# Patient Record
Sex: Female | Born: 1935 | Race: White | Hispanic: No | Marital: Married | Smoking: Never smoker
Health system: Southern US, Community
[De-identification: ages and names within clinical notes are randomized; demographics above are authoritative.]

## PROBLEM LIST (undated history)

## (undated) DIAGNOSIS — K559 Vascular disorder of intestine, unspecified: Secondary | ICD-10-CM

## (undated) DIAGNOSIS — K449 Diaphragmatic hernia without obstruction or gangrene: Secondary | ICD-10-CM

## (undated) DIAGNOSIS — G20 Parkinson's disease: Secondary | ICD-10-CM

## (undated) DIAGNOSIS — G20A1 Parkinson's disease without dyskinesia, without mention of fluctuations: Secondary | ICD-10-CM

## (undated) DIAGNOSIS — C801 Malignant (primary) neoplasm, unspecified: Secondary | ICD-10-CM

## (undated) DIAGNOSIS — G709 Myoneural disorder, unspecified: Secondary | ICD-10-CM

## (undated) DIAGNOSIS — K469 Unspecified abdominal hernia without obstruction or gangrene: Secondary | ICD-10-CM

## (undated) HISTORY — DX: Unspecified abdominal hernia without obstruction or gangrene: K46.9

## (undated) HISTORY — DX: Diaphragmatic hernia without obstruction or gangrene: K44.9

## (undated) HISTORY — DX: Parkinson's disease: G20

## (undated) HISTORY — DX: Myoneural disorder, unspecified: G70.9

## (undated) HISTORY — DX: Vascular disorder of intestine, unspecified: K55.9

## (undated) HISTORY — DX: Malignant (primary) neoplasm, unspecified: C80.1

## (undated) HISTORY — DX: Parkinson's disease without dyskinesia, without mention of fluctuations: G20.A1

---
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
## 1979-11-05 HISTORY — PX: CHOLECYSTECTOMY: SHX55

## 1988-11-04 DIAGNOSIS — G20 Parkinson's disease: Secondary | ICD-10-CM

## 1988-11-04 DIAGNOSIS — G20A1 Parkinson's disease without dyskinesia, without mention of fluctuations: Secondary | ICD-10-CM

## 1988-11-04 HISTORY — DX: Parkinson's disease: G20

## 1988-11-04 HISTORY — DX: Parkinson's disease without dyskinesia, without mention of fluctuations: G20.A1

## 2004-09-18 ENCOUNTER — Ambulatory Visit: Payer: Self-pay | Admitting: Internal Medicine

## 2004-12-09 ENCOUNTER — Inpatient Hospital Stay: Payer: Self-pay | Admitting: Internal Medicine

## 2004-12-09 ENCOUNTER — Other Ambulatory Visit: Payer: Self-pay

## 2005-12-30 ENCOUNTER — Ambulatory Visit: Payer: Self-pay | Admitting: Internal Medicine

## 2006-01-15 ENCOUNTER — Emergency Department: Payer: Self-pay | Admitting: Emergency Medicine

## 2006-03-04 ENCOUNTER — Ambulatory Visit: Payer: Self-pay | Admitting: Internal Medicine

## 2007-03-10 ENCOUNTER — Ambulatory Visit: Payer: Self-pay | Admitting: Internal Medicine

## 2007-10-22 ENCOUNTER — Other Ambulatory Visit: Payer: Self-pay

## 2007-10-22 ENCOUNTER — Emergency Department: Payer: Self-pay | Admitting: Emergency Medicine

## 2007-12-02 ENCOUNTER — Ambulatory Visit: Payer: Self-pay | Admitting: Internal Medicine

## 2007-12-29 ENCOUNTER — Ambulatory Visit: Payer: Self-pay | Admitting: Gastroenterology

## 2008-12-07 ENCOUNTER — Inpatient Hospital Stay: Payer: Self-pay | Admitting: Internal Medicine

## 2009-01-31 ENCOUNTER — Emergency Department: Payer: Self-pay | Admitting: Emergency Medicine

## 2009-03-06 ENCOUNTER — Ambulatory Visit: Payer: Self-pay | Admitting: Unknown Physician Specialty

## 2009-03-21 ENCOUNTER — Ambulatory Visit: Payer: Self-pay | Admitting: Unknown Physician Specialty

## 2009-04-04 ENCOUNTER — Ambulatory Visit: Payer: Self-pay | Admitting: Gynecologic Oncology

## 2009-04-10 ENCOUNTER — Ambulatory Visit: Payer: Self-pay | Admitting: Gynecologic Oncology

## 2009-05-04 ENCOUNTER — Ambulatory Visit: Payer: Self-pay | Admitting: Gynecologic Oncology

## 2009-08-06 ENCOUNTER — Emergency Department: Payer: Self-pay | Admitting: Internal Medicine

## 2010-01-23 ENCOUNTER — Inpatient Hospital Stay: Payer: Self-pay | Admitting: Specialist

## 2011-05-05 ENCOUNTER — Ambulatory Visit: Payer: Self-pay | Admitting: Internal Medicine

## 2011-05-28 ENCOUNTER — Inpatient Hospital Stay: Payer: Self-pay | Admitting: Internal Medicine

## 2011-06-05 ENCOUNTER — Ambulatory Visit: Payer: Self-pay | Admitting: Internal Medicine

## 2011-07-09 ENCOUNTER — Other Ambulatory Visit: Payer: Self-pay | Admitting: Internal Medicine

## 2011-07-09 MED ORDER — AMLODIPINE BESYLATE 5 MG PO TABS: 5.0000 mg | ORAL_TABLET | Freq: Every day | ORAL | Status: AC

## 2011-07-12 ENCOUNTER — Telehealth: Payer: Self-pay | Admitting: *Deleted

## 2011-07-12 NOTE — Telephone Encounter (Signed)
Lorelle Formosa says that patient has a uti. (she says that she faxed the result over to you on wed). She says that she needs an antibiotic. Also they are discharging patient from hospice because she is stable and patient is really upset about it. Patient is refusing to eat and Lorelle Formosa thinks that maybe it is because she is upset about being discharged. Patient is currently on Celexa 20mg  and Lorelle Formosa is asking if you would want to increase it. Patient uses [facility].

## 2011-07-12 NOTE — Telephone Encounter (Signed)
Unfortunately the UA is not in the chart, and there is no note in the chart about it. Can they resend it?

## 2011-07-12 NOTE — Telephone Encounter (Signed)
Left message for Maria Marsh to return my call.

## 2011-07-15 NOTE — Telephone Encounter (Signed)
Spoke with [facility], she will have them refaxed.

## 2011-07-19 ENCOUNTER — Encounter: Payer: Self-pay | Admitting: Internal Medicine

## 2011-07-19 ENCOUNTER — Ambulatory Visit (INDEPENDENT_AMBULATORY_CARE_PROVIDER_SITE_OTHER): Payer: Medicare Other | Admitting: Internal Medicine

## 2011-07-19 DIAGNOSIS — M6281 Muscle weakness (generalized): Secondary | ICD-10-CM

## 2011-07-19 DIAGNOSIS — I10 Essential (primary) hypertension: Secondary | ICD-10-CM

## 2011-07-19 DIAGNOSIS — R29898 Other symptoms and signs involving the musculoskeletal system: Secondary | ICD-10-CM

## 2011-07-19 DIAGNOSIS — M436 Torticollis: Secondary | ICD-10-CM

## 2011-07-21 ENCOUNTER — Encounter: Payer: Self-pay | Admitting: Internal Medicine

## 2011-07-21 DIAGNOSIS — C801 Malignant (primary) neoplasm, unspecified: Secondary | ICD-10-CM | POA: Insufficient documentation

## 2011-07-21 DIAGNOSIS — G709 Myoneural disorder, unspecified: Secondary | ICD-10-CM | POA: Insufficient documentation

## 2011-07-21 DIAGNOSIS — H409 Unspecified glaucoma: Secondary | ICD-10-CM | POA: Insufficient documentation

## 2011-07-21 DIAGNOSIS — I10 Essential (primary) hypertension: Secondary | ICD-10-CM | POA: Insufficient documentation

## 2011-07-21 DIAGNOSIS — K449 Diaphragmatic hernia without obstruction or gangrene: Secondary | ICD-10-CM | POA: Insufficient documentation

## 2011-07-21 DIAGNOSIS — G20 Parkinson's disease: Secondary | ICD-10-CM | POA: Insufficient documentation

## 2011-07-21 NOTE — Progress Notes (Signed)
Subjective:    Patient ID: Maria Marsh, female    DOB: 10/05/1922, 77 y.o.   MRN: [id number]  HPI  Maria Marsh is an 77 yo white female with  a history of severe neck contractures and Parkinsons Disease who is brought to visit by family members.  She was recently admitted to [facility] with dehydration and UTI and recently discharged from Hospice bur continues to live at [facility].  She needs assistance with all ADLS due to her neuromuscular conditions and is unable to walk farther than 2 or 3 steps.  She requires a hospital bed because of her contractures which make it impossible to position her in an ordinary bed.  Past Medical History  Diagnosis Date  . Hernia     Hitatal  . Colitis, ischemic   . Parkinson disease 1990    Dr. Kemper Durie  . Cancer     endometrial  . Glaucoma   . Neuromuscular disorder     Sternal fracture with neck contracture  . Esophageal hiatal hernia     wth stricture, dilated 2010  . Parkinson's disease (tremor, stiffness, slow motion, unstable posture)    Scheduled Meds:   Continuous Infusions:   PRN Meds:.     Review of Systems  Constitutional: Negative for fever, chills and unexpected weight change.  HENT: Positive for neck pain and neck stiffness. Negative for hearing loss, ear pain, nosebleeds, congestion, sore throat, facial swelling, rhinorrhea, sneezing, mouth sores, trouble swallowing, voice change, postnasal drip, sinus pressure, tinnitus and ear discharge.   Eyes: Negative for pain, discharge, redness and visual disturbance.  Respiratory: Negative for cough, chest tightness, shortness of breath, wheezing and stridor.   Cardiovascular: Negative for chest pain, palpitations and leg swelling.  Musculoskeletal: Positive for back pain and gait problem. Negative for myalgias and arthralgias.  Skin: Negative for color change and rash.  Neurological: Positive for tremors and weakness. Negative for dizziness, light-headedness and headaches.  Hematological:  Negative for adenopathy.       Objective:   Physical Exam  Constitutional: She is oriented to person, place, and time. She appears well-developed and well-nourished.  HENT:  Mouth/Throat: Oropharynx is clear and moist.  Eyes: EOM are normal. Pupils are equal, round, and reactive to light. No scleral icterus.  Neck: No JVD present. Rigidity present. No thyromegaly present.    Cardiovascular: Normal rate, regular rhythm, normal heart sounds and intact distal pulses.   Pulmonary/Chest: Effort normal and breath sounds normal.  Abdominal: Soft. Bowel sounds are normal. She exhibits no mass. There is no tenderness.  Musculoskeletal: Normal range of motion. She exhibits no edema.       Right elbow: She exhibits deformity.       Arms: Lymphadenopathy:    She has no cervical adenopathy.  Neurological: She is alert and oriented to person, place, and time.  Skin: Skin is warm and dry.  Psychiatric: She has a normal mood and affect.          Assessment & Plan:  Gait disorder:  Due to chronic neck contractures, spinal stenosis and Parkinsons diease,  patint is unable to ambulate more than 2 steps without 2 2 person maximal assistance and requires a lightweight manual wheelchair for transporation to bathroom and dining room.  Neck contractures:resulting in an severely contorted body habitus.   She cannot be maintained in a regular hospital bed.  Previous attempts to place her using pillows and wedges have been unsuccessful and she is at increased risk for pressure ulcers.  She will need to use a semi electric hospital bed to maintaing positioning that does not result in pressure ulcers.

## 2011-07-21 NOTE — Assessment & Plan Note (Signed)
Uncontrolled currently on multiple medications,.. No readings were available from the facility.  I have asked that they submit daily measurements for one week so I can adjust meds.

## 2011-08-15 ENCOUNTER — Encounter: Payer: Self-pay | Admitting: Internal Medicine

## 2011-09-02 ENCOUNTER — Encounter: Payer: Self-pay | Admitting: Internal Medicine

## 2011-09-04 ENCOUNTER — Telehealth: Payer: Self-pay | Admitting: Internal Medicine

## 2011-09-04 DIAGNOSIS — N390 Urinary tract infection, site not specified: Secondary | ICD-10-CM

## 2011-09-04 MED ORDER — NITROFURANTOIN MONOHYD MACRO 100 MG PO CAPS: 100.0000 mg | ORAL_CAPSULE | Freq: Two times a day (BID) | ORAL | Status: AC

## 2011-09-04 NOTE — Telephone Encounter (Signed)
She has a UTI from E Coli.  rx macrobid 100 mg twice daily x 7 days   #14  rx sent to [facility].

## 2011-09-05 NOTE — Telephone Encounter (Signed)
Notified patients sister and [facility] of the Rx.

## 2011-09-11 ENCOUNTER — Encounter: Payer: Self-pay | Admitting: Internal Medicine

## 2011-09-12 ENCOUNTER — Encounter: Payer: Self-pay | Admitting: Internal Medicine

## 2011-09-12 ENCOUNTER — Telehealth: Payer: Self-pay | Admitting: Internal Medicine

## 2011-09-12 DIAGNOSIS — N390 Urinary tract infection, site not specified: Secondary | ICD-10-CM

## 2011-09-12 MED ORDER — NITROFURANTOIN MONOHYD MACRO 100 MG PO CAPS: 100.0000 mg | ORAL_CAPSULE | Freq: Two times a day (BID) | ORAL | Status: AC

## 2011-09-12 NOTE — Telephone Encounter (Signed)
I have rx Macrobid for her current UTI.  Please call to [facility] for patient.  One tablet twice daily x 7 days  #14 no refills.  Please ask facility to send the cultures and the UAs together and some info re whether patietn is having symptoms so we are not treating asymptomatic infections because we are runnign out of antibiotics to treat her with.

## 2011-09-16 ENCOUNTER — Telehealth: Payer: Self-pay | Admitting: Internal Medicine

## 2011-09-16 NOTE — Telephone Encounter (Signed)
Facility has received Rx.  They will be faxing over the culture results.

## 2011-09-17 NOTE — Telephone Encounter (Signed)
Faxed over order from 07-19-11 for the hospital bed stating that other beds are not possible due to contractures of neck.

## 2011-09-24 ENCOUNTER — Other Ambulatory Visit: Payer: Self-pay | Admitting: Internal Medicine

## 2011-09-24 DIAGNOSIS — M436 Torticollis: Secondary | ICD-10-CM

## 2011-09-27 ENCOUNTER — Other Ambulatory Visit: Payer: Self-pay | Admitting: Internal Medicine

## 2011-10-01 ENCOUNTER — Encounter: Payer: Self-pay | Admitting: Internal Medicine

## 2011-10-01 ENCOUNTER — Encounter: Payer: Self-pay | Admitting: Neurology

## 2011-10-07 ENCOUNTER — Telehealth: Payer: Self-pay | Admitting: Neurology

## 2011-10-07 NOTE — Telephone Encounter (Signed)
jan  cld you call the daughter to find out what there questions are since it appears that i havent seen them.

## 2011-10-07 NOTE — Telephone Encounter (Signed)
Dr. Darrick Huntsman,  Not having met the patient, I am not sure what her condition is, but I guess there is some question that she has a cervical dystonia and may need Botox.  Unfortunately, I don't do chemodenervation so if you think that is where she is going to end up, then perhaps a referral to someone else might make sense.  Stacy Gardner at [facility] is good at this.  we are trying to bring on someone who does botox but this is going to take a while.  Let me know if this only confuses things.  Matt

## 2011-10-07 NOTE — Telephone Encounter (Signed)
Pt is coming in for neck contracture and sister has a few questions. They were told she may need botox injections which she may need to be referred out for. Sister is power of attorney.
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
## 2011-10-08 NOTE — Telephone Encounter (Signed)
Carollee Herter,  See message below regarding need for referral for cervical dysotnia to Stacy Gardner at [facility]  Or [facility] if someone there dose Botx injections.

## 2011-10-10 ENCOUNTER — Encounter: Payer: Self-pay | Admitting: Internal Medicine

## 2011-11-07 ENCOUNTER — Ambulatory Visit: Payer: BC Managed Care – PPO | Admitting: Neurology

## 2011-11-11 ENCOUNTER — Telehealth: Payer: Self-pay | Admitting: Internal Medicine

## 2011-11-11 MED ORDER — HYDROCODONE-ACETAMINOPHEN 5-325 MG PO TABS: 1.0000 | ORAL_TABLET | Freq: Four times a day (QID) | ORAL | Status: DC | PRN

## 2011-11-11 NOTE — Telephone Encounter (Signed)
[phone] Lucille called ms States needs refill her norco hydrocoden 5-325  [facility] can [facility]   Malena Catholic will also need hardcopy of this rx Pt is completely out.

## 2011-11-11 NOTE — Telephone Encounter (Signed)
Script faxed to [facility], fax number [phone].

## 2011-11-11 NOTE — Telephone Encounter (Signed)
rx is on your printer

## 2011-11-14 NOTE — Telephone Encounter (Signed)
Hard copy script mailed to the [facility], attn Lucille.

## 2011-11-18 ENCOUNTER — Encounter: Payer: Self-pay | Admitting: Internal Medicine

## 2012-02-17 ENCOUNTER — Encounter: Payer: Self-pay | Admitting: Internal Medicine

## 2012-05-01 ENCOUNTER — Encounter: Payer: Self-pay | Admitting: Internal Medicine

## 2012-05-25 ENCOUNTER — Encounter: Payer: Self-pay | Admitting: Internal Medicine

## 2012-05-25 ENCOUNTER — Ambulatory Visit (INDEPENDENT_AMBULATORY_CARE_PROVIDER_SITE_OTHER): Payer: Medicare Other | Admitting: Internal Medicine

## 2012-05-25 VITALS — BP 132/68 | HR 94 | Temp 97.9°F | Resp 14

## 2012-05-25 DIAGNOSIS — R600 Localized edema: Secondary | ICD-10-CM | POA: Insufficient documentation

## 2012-05-25 DIAGNOSIS — R609 Edema, unspecified: Secondary | ICD-10-CM

## 2012-05-25 DIAGNOSIS — B372 Candidiasis of skin and nail: Secondary | ICD-10-CM

## 2012-05-25 MED ORDER — GOLD BOND CORNSTARCH PLUS EX POWD: CUTANEOUS | Status: AC

## 2012-05-25 MED ORDER — NYSTATIN 100000 UNIT/GM EX OINT: TOPICAL_OINTMENT | Freq: Two times a day (BID) | CUTANEOUS | Status: DC

## 2012-05-25 NOTE — Progress Notes (Signed)
Patient ID: Maria Marsh, female   DOB: 06-Sep-1922, 78 y.o.   MRN: [id number]  Patient Active Problem List  Diagnosis  . Cancer  . Glaucoma  . Esophageal hiatal hernia  . Neuromuscular disorder  . Parkinson's disease (tremor, stiffness, slow motion, unstable posture)  . Hypertension  . Candidiasis of skin  . Facial edema    Subjective:  CC:   Chief Complaint  Patient presents with  . Facial Swelling    HPI:   Maria Mcfall Johnsonis a 78 y.o. female who presents with chief complaint of swelling and redness on left side of face. It first claims that there is no signs of erythema or swelling of her face. She does have a chronically contracted neck and as a result her left cheek is swollen due to dependent edema from possibly hanging open. However palpation of her jaw and in gentle manipulation of her neck reveals a very erythematous moist region with now order extending from the underside of the left mandible to the skin of the over her left clavicle. She states she has occasional itching but is referring more to her for head which appeared entirely normal today.  Past Medical History  Diagnosis Date  . Hernia     Hitatal  . Colitis, ischemic   . Parkinson disease 1990    Dr. Kemper Durie  . Glaucoma   . Neuromuscular disorder     Sternal fracture with neck contracture  . Esophageal hiatal hernia     wth stricture, dilated 2010  . Parkinson's disease (tremor, stiffness, slow motion, unstable posture)   . Cancer     endometrial    Past Surgical History  Procedure Date  . Appendectomy 1949  . Cholecystectomy 1981         The following portions of the patient's history were reviewed and updated as appropriate: Allergies, current medications, and problem list.    Review of Systems:   Review of Systems  Constitutional: Negative for fever, chills and weight loss.  HENT: Negative for congestion and neck pain.   Eyes: Positive for blurred vision.  Respiratory: Negative  for cough and shortness of breath.   Cardiovascular: Positive for leg swelling. Negative for chest pain.  Gastrointestinal: Positive for constipation. Negative for heartburn, nausea and vomiting.  Genitourinary: Negative for dysuria.  Skin: Positive for itching and rash.  Neurological: Negative for dizziness, focal weakness and headaches.  Endo/Heme/Allergies: Does not bruise/bleed easily.  Psychiatric/Behavioral: Negative for depression. The patient does not have insomnia.       History   Social History  . Marital Status: Married    Spouse Name: N/A    Number of Children: N/A  . Years of Education: N/A   Occupational History  . Not on file.   Social History Main Topics  . Smoking status: Never Smoker   . Smokeless tobacco: Never Used  . Alcohol Use: No  . Drug Use: No  . Sexually Active: Not on file   Other Topics Concern  . Not on file   Social History Narrative  . No narrative on file    Objective:  BP 132/68  Pulse 94  Temp 97.9 F (36.6 C) (Oral)  Resp 14  SpO2 84%  General appearance: alert, cooperative and appears stated age Ears: normal TM's and external ear canals both ears FAce: left cheek slack,  skin thickened non erythematous  Throat: lips, mucosa, and tongue normal; teeth and gums normal Neck: no adenopathy, no carotid bruit, supple, symmetrical, trachea  midline and thyroid not enlarged, symmetric, no tenderness/mass/nodules Back: symmetric, no curvature. ROM normal. No CVA tenderness. Lungs: clear to auscultation bilaterally Heart: regular rate and rhythm, S1, S2 normal, no murmur, click, rub or gallop Abdomen: soft, non-tender; bowel sounds normal; no masses,  no organomegaly Pulses: 2+ and symmetric Skin: erythematous, moist intertigonus area between left mandible and left chest wall due to contracture. Malodourous.  Lymph nodes: Cervical, supraclavicular, and axillary nodes normal.  Assessment and Plan:  Candidiasis of skin Occurring in  the fold between her left mandible and her left shoulder. Brought on by severe contraction of neck. The space in this area is raw and has the odor of yeast. Will start nystatin ointment twice daily with sterile gauze to pack the area. Once the rash is resolved continue antifungal starts to for metastatic treatment with Goldbond powder and continued packing.  Facial edema She does have slight thickening of the skin on the posterior border of the left cheek. This is due to chronic swelling of the left she do to her her torticollis and contracture. I don't think there is anything we can do about this other than to identify is the cause of persistent dependent edema.   Updated Medication List Outpatient Encounter Prescriptions as of 05/25/2012  Medication Sig Dispense Refill  . acetaminophen (TYLENOL) 500 MG tablet Take 500 mg by mouth every 6 (six) hours as needed.        Marland Kitchen amLODipine (NORVASC) 5 MG tablet Take 1 tablet (5 mg total) by mouth daily.  30 tablet  3  . bisacodyl (BISACODYL) 5 MG EC tablet Take 10 mg by mouth daily as needed.      . brimonidine-timolol (COMBIGAN) 0.2-0.5 % ophthalmic solution Place 1 drop into both eyes every 12 (twelve) hours.        . calcitonin, salmon, (MIACALCIN/FORTICAL) 200 UNIT/ACT nasal spray Place 1 spray into the nose daily.        . carbidopa-levodopa (SINEMET) 25-100 MG per tablet Take 1 tablet by mouth 4 (four) times daily.        . citalopram (CELEXA) 20 MG tablet Take 20 mg by mouth daily.        Marland Kitchen docusate sodium (COLACE) 100 MG capsule Take 100 mg by mouth 2 (two) times daily.        Marland Kitchen HYDROcodone-acetaminophen (NORCO) 5-325 MG per tablet Take 1 tablet by mouth every 6 (six) hours as needed for pain.  90 tablet  5  . meloxicam (MOBIC) 15 MG tablet Take 15 mg by mouth daily.        . metoprolol (TOPROL-XL) 50 MG 24 hr tablet Take 50 mg by mouth 2 (two) times daily.        Marland Kitchen oxyCODONE (ROXICODONE INTENSOL) 20 MG/ML concentrated solution Take 10 mg by mouth  every 2 (two) hours as needed.        . pantoprazole (PROTONIX) 40 MG tablet Take 40 mg by mouth daily.        . polyethylene glycol (MIRALAX / GLYCOLAX) packet Take 17 g by mouth daily.        . QUEtiapine (SEROQUEL) 25 MG tablet Take 25 mg by mouth at bedtime.        . sennosides-docusate sodium (SENOKOT-S) 8.6-50 MG tablet Take 1 tablet by mouth daily.        . travoprost, benzalkonium, (TRAVATAN) 0.004 % ophthalmic solution 1 drop at bedtime.        Marland Kitchen nystatin ointment (MYCOSTATIN) Apply topically 2 (  two) times daily. Until the redness has resolved.  Pack area with sterile gauze  30 g  0  . Powders (GOLD BOND CORNSTARCH PLUS) POWD Apply once daily to fold of skin in neck  113 g  0  . DISCONTD: brimonidine (ALPHAGAN) 0.15 % ophthalmic solution 1 drop 3 (three) times daily.        Marland Kitchen DISCONTD: losartan (COZAAR) 100 MG tablet Take 100 mg by mouth daily.        Marland Kitchen DISCONTD: nystatin ointment (MYCOSTATIN) Apply topically 2 (two) times daily. Until the redness has resolved.  Pack area with sterile gauze  30 g  0     No orders of the defined types were placed in this encounter.    No Follow-up on file.

## 2012-05-25 NOTE — Assessment & Plan Note (Signed)
She does have slight thickening of the skin on the posterior border of the left cheek. This is due to chronic swelling of the left she do to her her torticollis and contracture. I don't think there is anything we can do about this other than to identify is the cause of persistent dependent edema.

## 2012-05-25 NOTE — Assessment & Plan Note (Signed)
Occurring in the fold between her left mandible and her left shoulder. Brought on by severe contraction of neck. The space in this area is raw and has the odor of yeast. Will start nystatin ointment twice daily with sterile gauze to pack the area. Once the rash is resolved continue antifungal starts to for metastatic treatment with Goldbond powder and continued packing.

## 2012-05-25 NOTE — Patient Instructions (Addendum)
Maria Marsh has two issues regarding her face,  Both caused by her neck contracture:  1) The cheek is not swollen,  It has dependent edema from her head tilt,  And the skin is becoming thickened as a result.  There is nothing that can be done about it.  2) She has a yeast infection from the skin of her left chin being in constant contact with the skin of her upper chest/shoulder:            Please apply Nystatin ointment twice daily           Pack the area with sterile gauze to prevent the anaerobic environment the skin contact has made.  Once the redness has resolved,  Switch to gold bond Powder applied daily in the crease/pocket and continue to place sterile gauze in the crease to prevent recurrence.

## 2012-06-23 ENCOUNTER — Other Ambulatory Visit: Payer: Self-pay | Admitting: Internal Medicine

## 2012-06-23 DIAGNOSIS — N390 Urinary tract infection, site not specified: Secondary | ICD-10-CM

## 2012-06-23 MED ORDER — CIPROFLOXACIN HCL 250 MG PO TABS: 250.0000 mg | ORAL_TABLET | Freq: Two times a day (BID) | ORAL | Status: AC

## 2012-06-26 ENCOUNTER — Encounter: Payer: Self-pay | Admitting: Internal Medicine

## 2012-07-28 ENCOUNTER — Telehealth: Payer: Self-pay | Admitting: Internal Medicine

## 2012-07-28 NOTE — Telephone Encounter (Signed)
I am not an oncologist. When patient came to me many years ago she had already opted not to have treatment for uterine cancer. A certain amount of spotting is to be expected. The spotting could get worse and she could have frank bleeding. She did also develop obstruction of the bowels if the cancer spread to the bowels. She does not need to be seen.

## 2012-07-28 NOTE — Telephone Encounter (Signed)
Caller: Shirley/Sibling; Patient Name: Maria Marsh; PCP: Duncan Dull (Adults only); Best Callback Phone Number: [phone].  Call regarding Pain and Spotting for Uterine Cancer.  Patient is resident at [facility].  Patient is not with Sister.  Patient has occasional spotting, not everyday.   Patient has Roxycodone as needed for pain, Sister doesn't feel Patient's pain or spotting has worsen since last Office Visit.  Sister doesn't want to have to bring Patient to office if MD doesn't feel she needs to come due to MD told Patient she would have occastional spotting and pain.    Patient opted not to take Chemo or Radiation.  If MD feels Patient needs to be seen, Sister will bring to office, transportation is difficult.  Sister would like MD to follow up with [facility] facility to discuss what is to be expected with Patient's stages of Uterine Cancer.

## 2012-07-29 NOTE — Telephone Encounter (Signed)
Left message asking Talbert Forest to return my call.

## 2012-07-31 NOTE — Telephone Encounter (Signed)
Patients sister notified.  She stated she will call [facility] [place] and explain it to them.

## 2012-08-17 ENCOUNTER — Telehealth: Payer: Self-pay | Admitting: Internal Medicine

## 2012-08-17 NOTE — Telephone Encounter (Signed)
please ask [facility] to send me results of the UA and culture on Maria Marsh .  I cannot treat the patient without seeing her unless they will send a UA and culture at the same time.

## 2012-08-21 NOTE — Telephone Encounter (Signed)
Spoke to [facility] she asked if she could get an order to get the patients urine and send you the results.

## 2012-08-22 NOTE — Telephone Encounter (Signed)
They already did, I reviewed it and sent it back in your red folder makerd "no UTI."

## 2012-08-28 ENCOUNTER — Ambulatory Visit: Payer: BC Managed Care – PPO | Admitting: Internal Medicine

## 2012-09-28 ENCOUNTER — Other Ambulatory Visit: Payer: Self-pay | Admitting: Internal Medicine

## 2012-09-28 MED ORDER — HYDROCODONE-ACETAMINOPHEN 10-325 MG PO TABS: 1.0000 | ORAL_TABLET | Freq: Every day | ORAL | Status: DC

## 2012-09-30 ENCOUNTER — Other Ambulatory Visit: Payer: Self-pay | Admitting: Internal Medicine

## 2012-10-29 ENCOUNTER — Encounter: Payer: Self-pay | Admitting: Internal Medicine

## 2012-10-30 ENCOUNTER — Encounter: Payer: Self-pay | Admitting: Adult Health

## 2012-10-30 ENCOUNTER — Ambulatory Visit (INDEPENDENT_AMBULATORY_CARE_PROVIDER_SITE_OTHER): Payer: Medicare Other | Admitting: Adult Health

## 2012-10-30 VITALS — BP 160/60 | HR 62 | Resp 16

## 2012-10-30 DIAGNOSIS — Z79899 Other long term (current) drug therapy: Secondary | ICD-10-CM | POA: Insufficient documentation

## 2012-10-30 DIAGNOSIS — T148XXA Other injury of unspecified body region, initial encounter: Secondary | ICD-10-CM | POA: Insufficient documentation

## 2012-10-30 MED ORDER — DOXYCYCLINE HYCLATE 100 MG PO TABS: 100.0000 mg | ORAL_TABLET | Freq: Two times a day (BID) | ORAL | Status: DC

## 2012-10-30 MED ORDER — POLYETHYLENE GLYCOL 3350 17 G PO PACK: PACK | ORAL | Status: AC

## 2012-10-30 NOTE — Assessment & Plan Note (Addendum)
Evaluation of medications with patient and family. Evaluation of continued benefit and side effect of her medications. Patient has been taking Miralax daily resulting in episodes of diarrhea. She is on pain medication for her contractures. Decreased Miralax to every 2 days. If she starts to get constipated, then can give the Miralax every other day. D/C Calcitonin, Calcium with Vit D. Also discontinued nystatin cream. All other medications were available to her on a prn status and felt that she may benefit from any one of them at a given time. Family agreed. Note that greater than 40 min were spent with patient/family in face to face communication.
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
## 2012-10-30 NOTE — Assessment & Plan Note (Addendum)
Purulent drainage noted. Culture sent. Start Doxycycline 100 mg bid x 10 days. Home Health referral for wound management. Referred to [facility]. Family in agreement.

## 2012-10-30 NOTE — Patient Instructions (Addendum)
  Please start doxycycline 100 mg twice daily for 10 days. [facility] [address].  [facility] referral for wound care. They will begin tomorrow.  Medication adjustments per discussion. Documentation sent to [facility].

## 2012-11-06 LAB — WOUND CULTURE: Gram Stain: NONE SEEN

## 2012-11-06 IMAGING — CR DG CHEST 1V PORT
1 series · 1 of 1 positions shown · non-contrast
Comparison: none

REASON FOR EXAM: fever
COMMENTS:

PROCEDURE:     DXR - DXR PORTABLE CHEST SINGLE VIEW  - May 28, 2011  [DATE]
RESULT:     Comparison: None

[view not recorded]
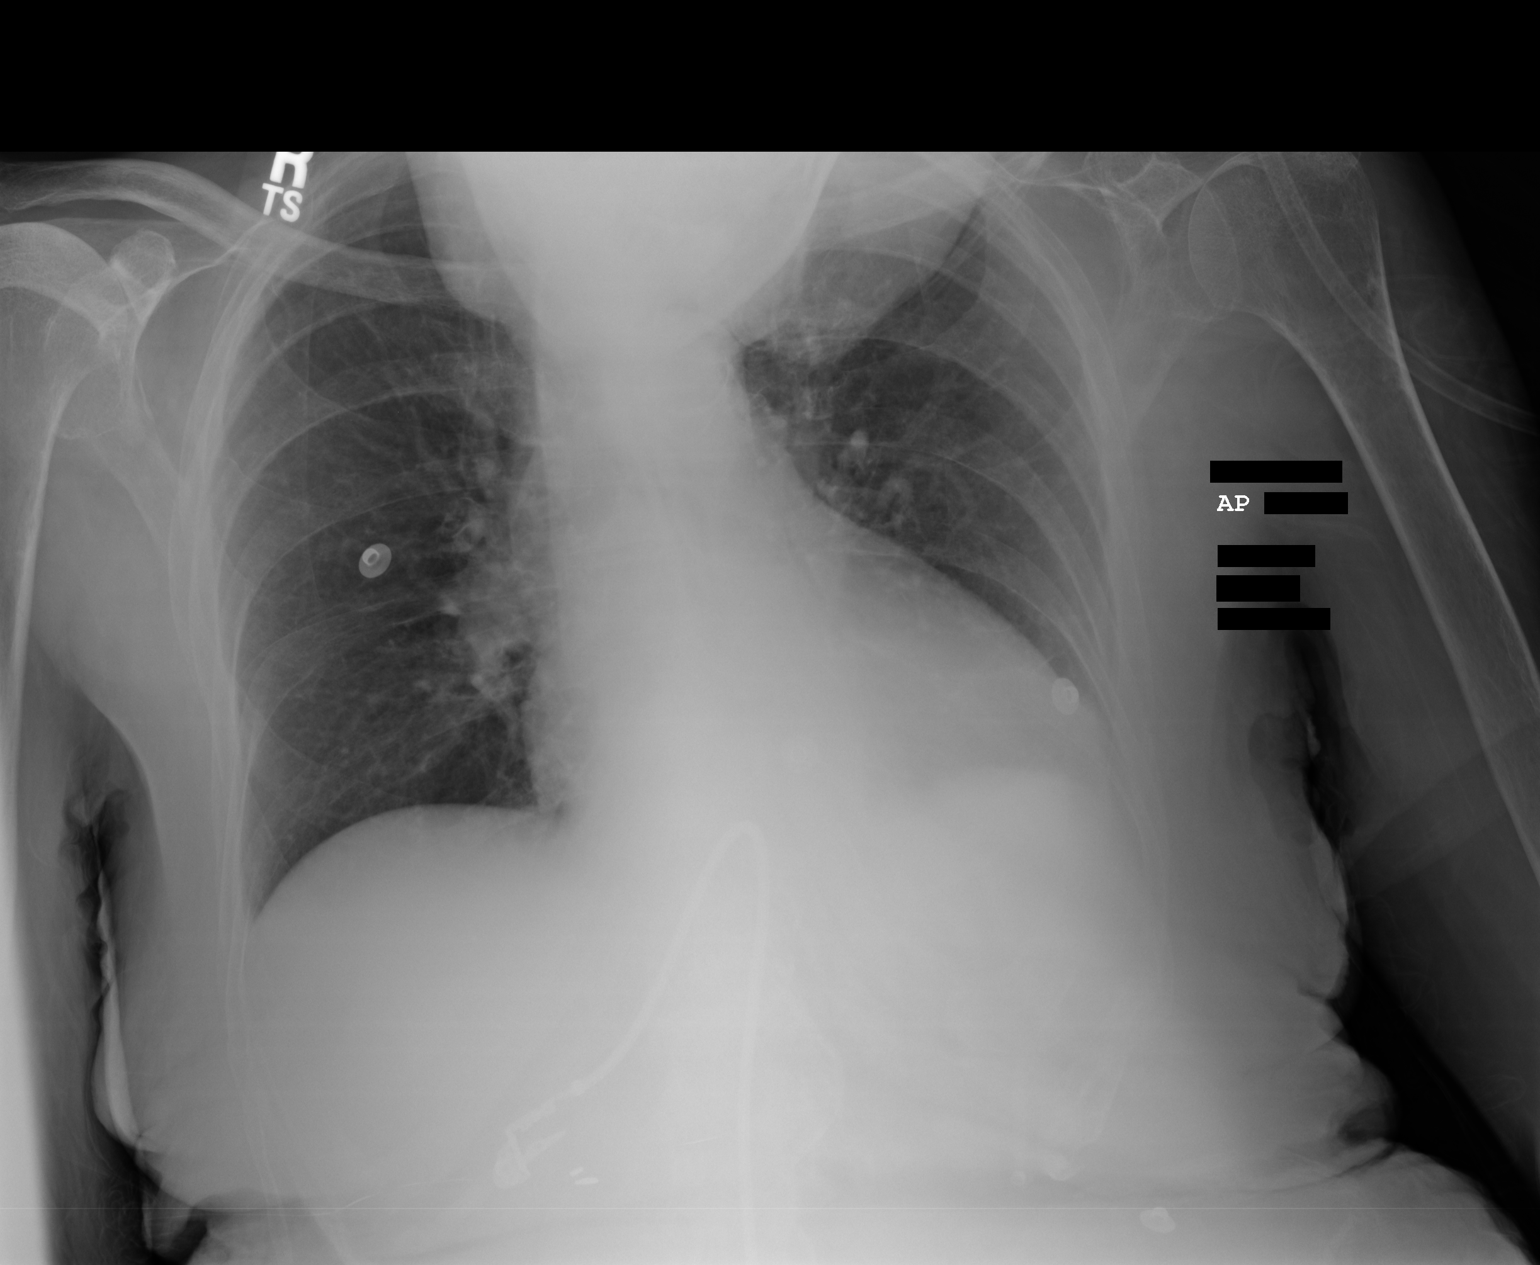

[1 of 1 positions shown; findings below may reference images not displayed]

FINDINGS: Single portable AP chest radiograph is provided.  There is no focal
parenchymal opacity, pleural effusion, or pneumothorax. Normal
cardiomediastinal silhouette. The osseous structures are unremarkable.
IMPRESSION: No acute disease of the chest.

## 2012-11-06 IMAGING — CT CT ABD-PELV W/ CM
1 of 2 series · 14 of 32 positions shown, 18 images · IV contrast (isovue)
Comparison: None

REASON FOR EXAM: (1) diffuse abdominal pain, fever; (2) diffuse abdominal
pain, fever
COMMENTS:

PROCEDURE:     CT  - CT ABDOMEN / PELVIS  W  - May 28, 2011  [DATE]
RESULT:     History: Abdominal pain
TECHNIQUE: Multiple axial images of the abdomen and pelvis were performed
from the lung bases to the pubic symphysis, without p.o. contrast and with
100 ml of Isovue 370 intravenous contrast.

[Series 3: 3mm soft tissue · axial · 0.72mm/px · z∈[-474,-110]mm · 14 of 138 slices shown, 18 images]
[im 11/138  soft-tissue]
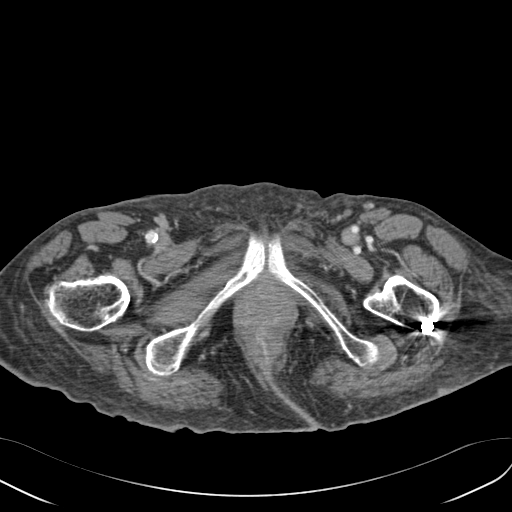
[im 11/138  bone]
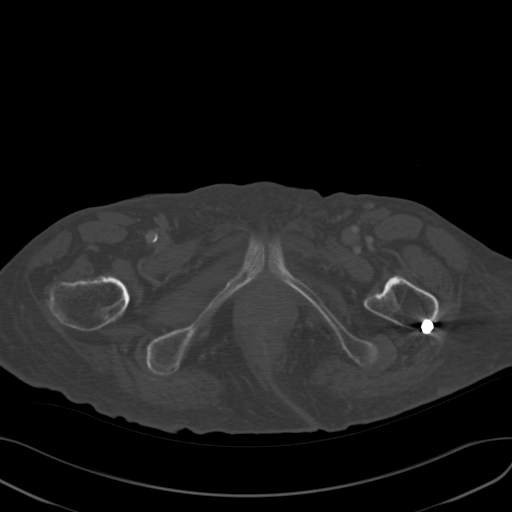
[im 22/138  soft-tissue]
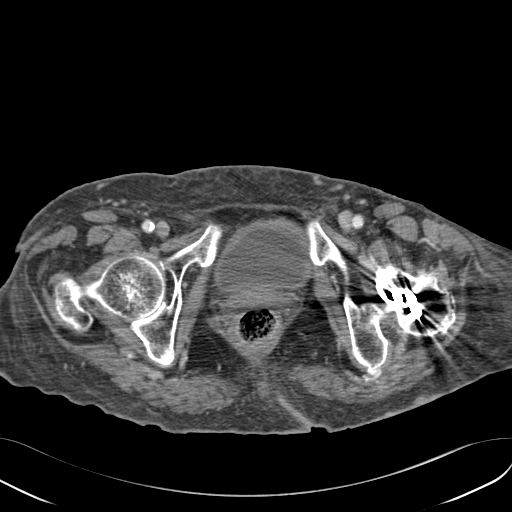
[im 33/138  soft-tissue]
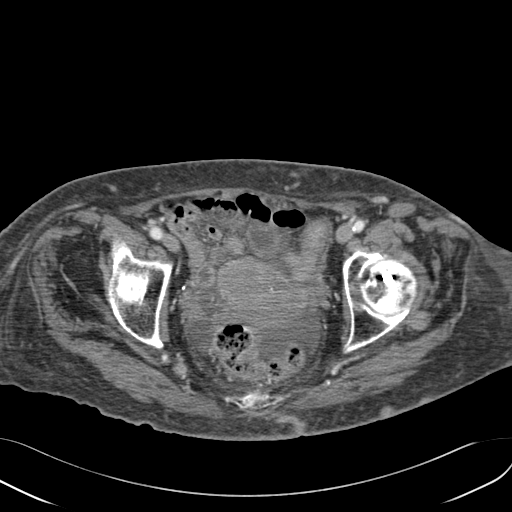
[im 44/138  soft-tissue]
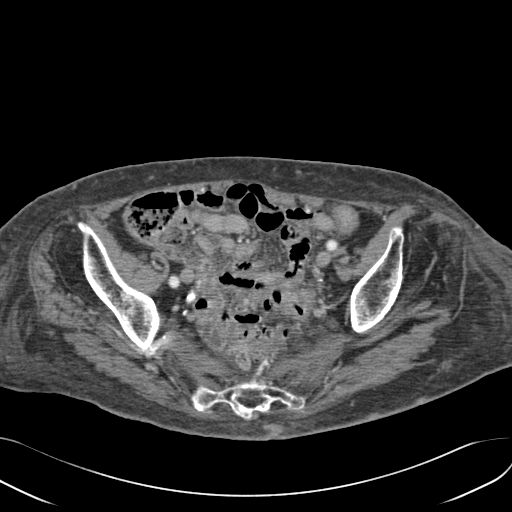
[im 55/138  soft-tissue]
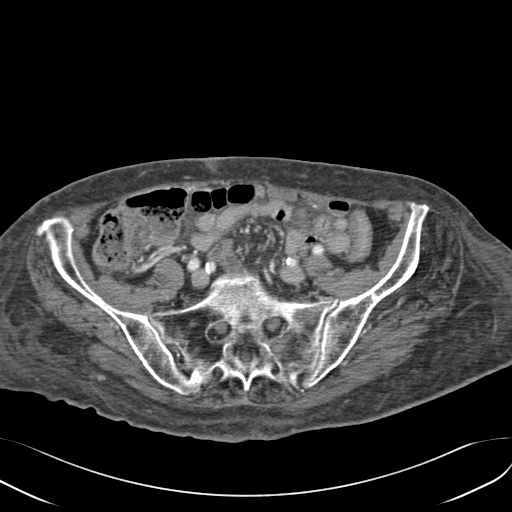
[im 66/138  soft-tissue]
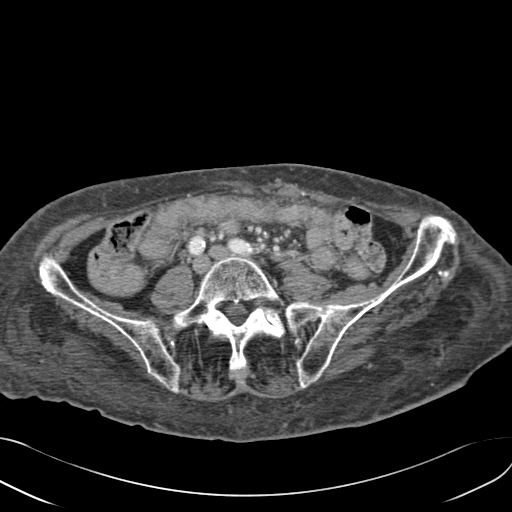
[im 77/138  soft-tissue]
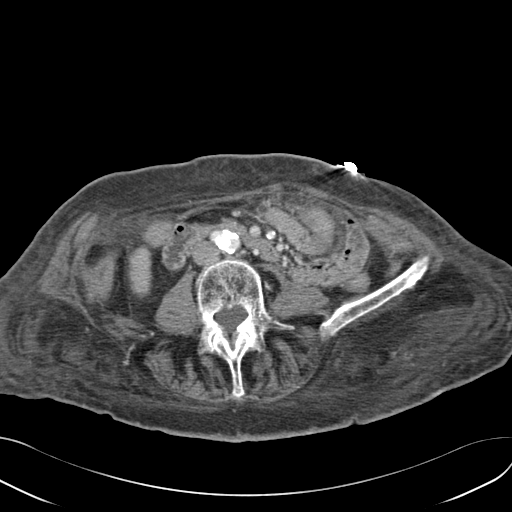
[im 88/138  soft-tissue]
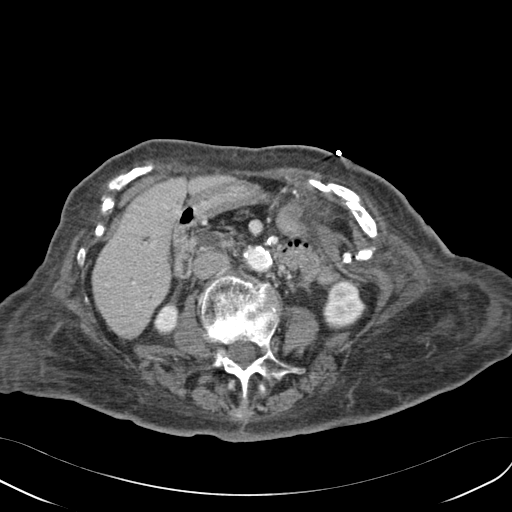
[im 99/138  soft-tissue]
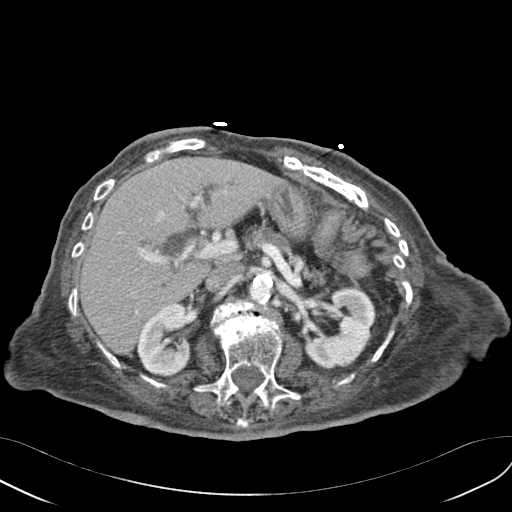
[im 99/138  bone]
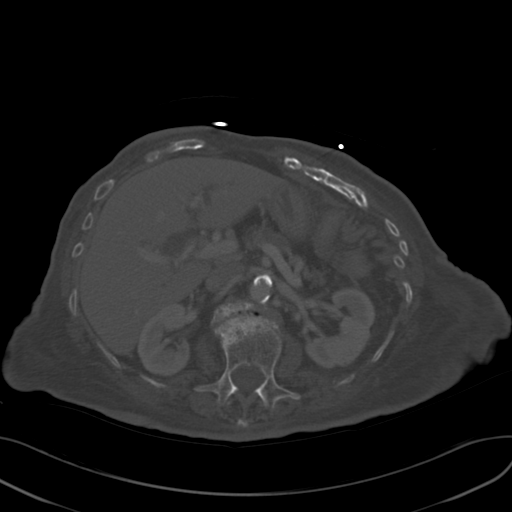
[im 110/138  soft-tissue]
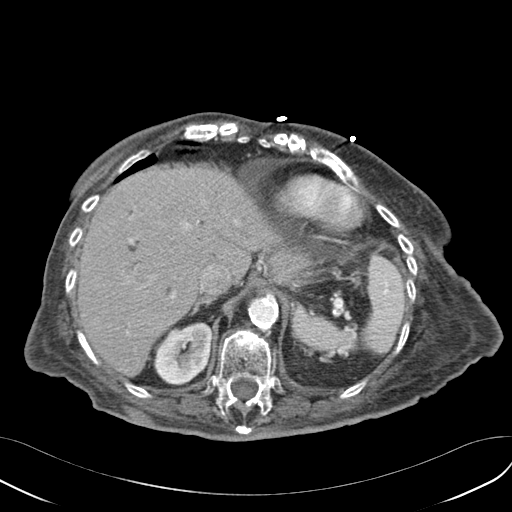
[im 116/138  lung]
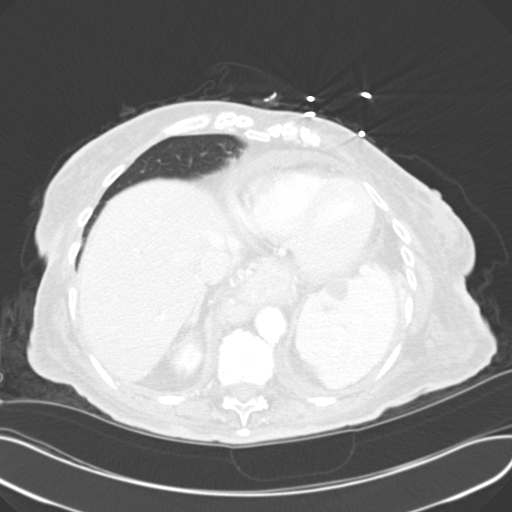
[im 121/138  soft-tissue]
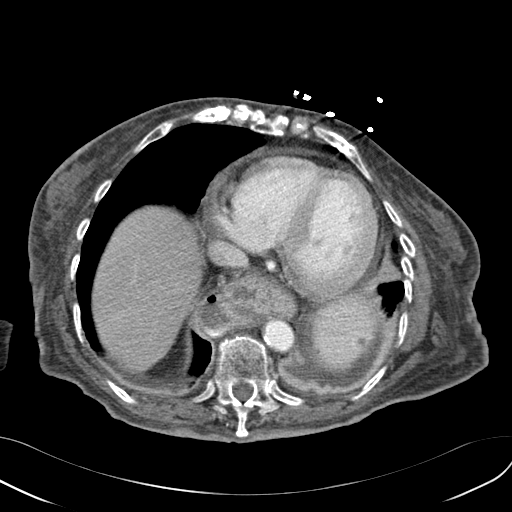
[im 121/138  lung]
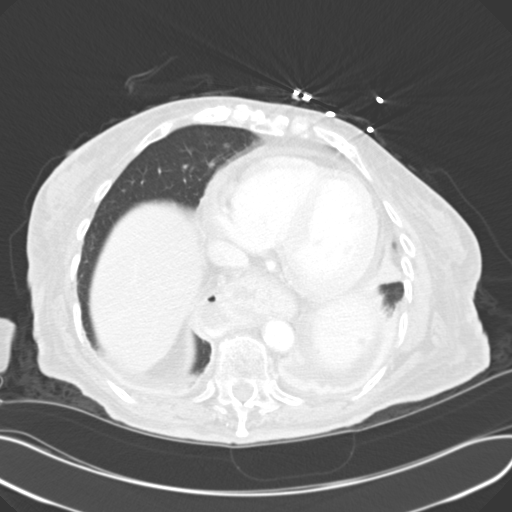
[im 127/138  lung]
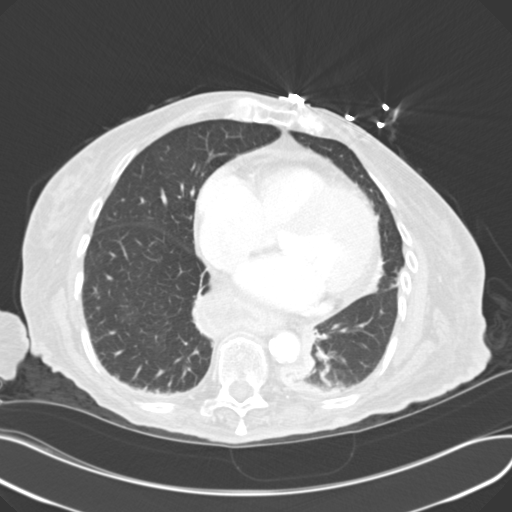
[im 132/138  soft-tissue]
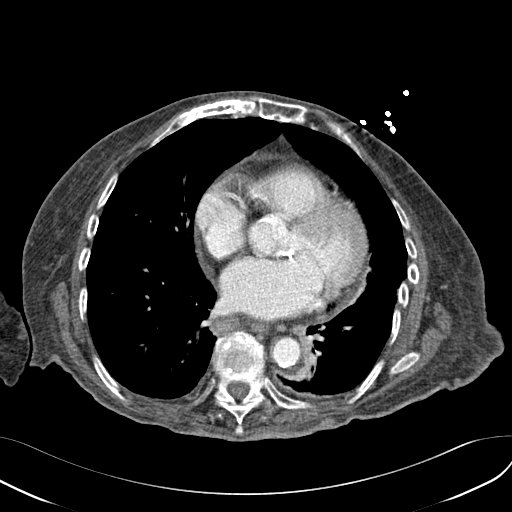
[im 132/138  lung]
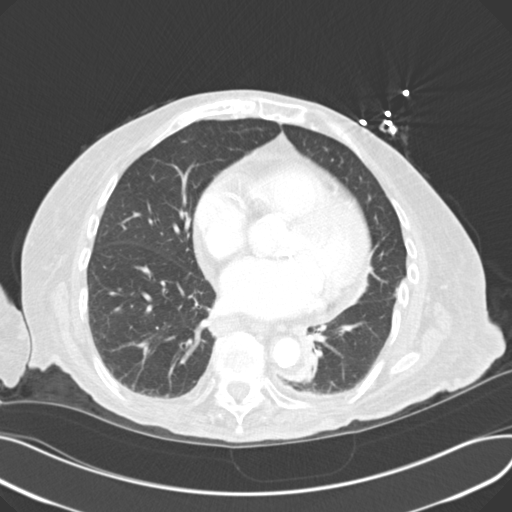

[14 of 32 positions shown; findings below may reference images not displayed]

FINDINGS: Evaluation of the bowel is extremely limited secondary to lack of enteric
contrast.

There is a small left pleural effusion. There is associated left basilar
airspace disease which may represent dependent atelectasis.. There is no
pneumothorax. The heart size is enlarged. There is a small pericardial
effusion versus pericardial thickening.

The liver demonstrates no focal abnormality. There is intrahepatic and
extrahepatic biliary ductal dilatation. The gallbladder is surgically
absent. The spleen demonstrates no focal abnormality. The kidneys, adrenal
glands, and pancreas are normal. There is mild bladder wall thickening which
may be secondary to cystitis.

There is a moderate size hiatal hernia. There is a mild bowel wall
thickening involving the ascending, descending, transverse and sigmoid
colon. There is mild pericolonic inflammatory change. There is a small
amount of nonspecific pelvic free fluid. There is no pneumoperitoneum,
pneumatosis, or portal venous gas. There is no lymphadenopathy. There is a
2.6 cm right ovarian the cystic mass.

The abdominal aorta is normal in caliber with atherosclerosis.

The osseous structures are unremarkable.
IMPRESSION: 1. Evaluation of the bowel is extremely limited secondary to lack of enteric
contrast. There is relative bowel wall thickening involving the ascending,
transverse, descending and sigmoid colon with mild pericolonic inflammatory
change concerning for colitis [REDACTED] be secondary to an infectious or
inflammatory etiology.

2. Intrahepatic and extrahepatic biliary ductal dilatation. The common bile
duct tapers to normal caliber at the pancreatic head. A distal stricture
cannot be excluded. The appearance may be secondary to a post
cholecystectomy state.

3. Mild bladder wall thickening which may be secondary to cystitis.

4. Cardiomegaly. Small pericardial effusion versus pericardial thickening.

5. There is a 2.6 cm cystic right ovarian mass. Recommend further evaluation
with a pelvic ultrasound.

## 2012-11-12 DIAGNOSIS — M436 Torticollis: Secondary | ICD-10-CM

## 2012-11-12 DIAGNOSIS — I10 Essential (primary) hypertension: Secondary | ICD-10-CM

## 2012-11-12 DIAGNOSIS — B3789 Other sites of candidiasis: Secondary | ICD-10-CM

## 2012-11-12 DIAGNOSIS — L98499 Non-pressure chronic ulcer of skin of other sites with unspecified severity: Secondary | ICD-10-CM

## 2012-11-14 ENCOUNTER — Emergency Department: Payer: Self-pay | Admitting: Emergency Medicine

## 2012-12-31 ENCOUNTER — Other Ambulatory Visit: Payer: Self-pay | Admitting: Internal Medicine

## 2012-12-31 DIAGNOSIS — L8992 Pressure ulcer of unspecified site, stage 2: Secondary | ICD-10-CM

## 2013-01-06 ENCOUNTER — Telehealth: Payer: Self-pay | Admitting: Internal Medicine

## 2013-01-06 NOTE — Telephone Encounter (Signed)
Ok,  [facility] unless patient has another home health agency prefence.  Check with patint

## 2013-01-06 NOTE — Telephone Encounter (Signed)
Kim from Wound center is calling back regarding referral that was placed for pt. She called the pt's care giver Lorayne Marek) and they are wanting home health to come to the pt instead of her coming to the wound care center. The care giver is saying that it is to much trouble to bring pt out.

## 2013-01-07 NOTE — Telephone Encounter (Signed)
Patient's sister Talbert Forest) notified as instructed by telephone. Talbert Forest will find out from her niece and call back if there is another home health agency that they prefer.

## 2013-01-07 NOTE — Telephone Encounter (Signed)
Patient's niece Joyce Gross [place]) called stating that [facility] been seeing the patient for wound care since her visit to the office when patient saw Orville Govern, NP. Joyce Gross stated that they want to stay with [facility] because the wound is looking much better. Joyce Gross states that [place] at [facility] is the nurse that has been going out to see patient. Joyce Gross wants to know if anything has been received from [place] at [facility] or should she call her and have her send information to Dr. Darrick Huntsman.  Call [place] back at [phone]

## 2013-01-07 NOTE — Telephone Encounter (Signed)
Maria Marsh,   Please disregard previous referral to Wound Care if you still have it open.  Please let Joyce Gross her POA know that [facility] is still managing the wound, I did get a preliminary culture from area by RN this morning.

## 2013-01-12 ENCOUNTER — Other Ambulatory Visit: Payer: Self-pay | Admitting: Internal Medicine

## 2013-01-12 ENCOUNTER — Telehealth: Payer: Self-pay | Admitting: Internal Medicine

## 2013-01-12 DIAGNOSIS — L8992 Pressure ulcer of unspecified site, stage 2: Secondary | ICD-10-CM

## 2013-01-12 MED ORDER — CIPROFLOXACIN HCL 250 MG PO TABS: 250.0000 mg | ORAL_TABLET | Freq: Two times a day (BID) | ORAL | Status: DC

## 2013-01-12 NOTE — Telephone Encounter (Signed)
A patient is here for evaluation of pressure ulcer on chest wall due to neck contraction. This is a via Aeronautical engineer from [facility] home health nurse. Please make sure that she has the appointment with wound care. They will need a copy of the wound culture that was done.

## 2013-01-15 ENCOUNTER — Other Ambulatory Visit: Payer: Self-pay | Admitting: Internal Medicine

## 2013-01-15 ENCOUNTER — Telehealth: Payer: Self-pay | Admitting: General Practice

## 2013-01-15 MED ORDER — CIPROFLOXACIN HCL 250 MG PO TABS: 250.0000 mg | ORAL_TABLET | Freq: Two times a day (BID) | ORAL | Status: AC

## 2013-01-15 NOTE — Telephone Encounter (Signed)
Wound is growing  Pseudomonas.  rx sent to facility on March 11.  On the last nursing order there was a message from RN said that Wound Care Center needed to see her.  Nothing is scanned into chart yet.  Ask Maria Marsh to confirm with RN on the case that she suggested Wound Care Referral

## 2013-01-15 NOTE — Telephone Encounter (Signed)
Med faxed to [facility]. Pt sister notified that pt will need to see wound care due to the infection. However [facility] will still also care for pt wounds. Also left a message for Sherrilyn Rist with [facility] to notify her.   Amber was this referral placed?

## 2013-01-15 NOTE — Telephone Encounter (Signed)
Apparently the order for cipro  was never sent back to [facility], but [facility]  I have printed it out please make sure [facility] gets it

## 2013-01-15 NOTE — Telephone Encounter (Signed)
Maria Marsh from [facility] called. Stated family is confused about the wound care referral. Stated they were told on 3/6 that referral was cancelled. However, if inction is present she will advise family that wound care referral is necessary. Has this referral been completed or discontinued. Also based on culture will any Abx bee sent in?

## 2013-01-25 ENCOUNTER — Encounter: Payer: Self-pay | Admitting: Nurse Practitioner

## 2013-01-25 ENCOUNTER — Encounter: Payer: Self-pay | Admitting: Cardiothoracic Surgery

## 2013-02-02 ENCOUNTER — Encounter: Payer: Self-pay | Admitting: Nurse Practitioner

## 2013-02-02 ENCOUNTER — Encounter: Payer: Self-pay | Admitting: Cardiothoracic Surgery

## 2013-02-09 ENCOUNTER — Encounter: Payer: Self-pay | Admitting: Internal Medicine

## 2013-03-18 ENCOUNTER — Telehealth: Payer: Self-pay | Admitting: *Deleted

## 2013-03-18 NOTE — Telephone Encounter (Signed)
Kayleen from [facility] called in reference to patient is allergic to Amoxicillin prescribed and would like a new order for antibiotic. Please advise

## 2013-03-18 NOTE — Telephone Encounter (Signed)
Script called to pharmacy

## 2013-03-18 NOTE — Telephone Encounter (Signed)
Ciprofloxacin 250 mg   twice daily for 7 days  #14 no refills.

## 2013-03-23 ENCOUNTER — Telehealth: Payer: Self-pay | Admitting: Internal Medicine

## 2013-03-23 DIAGNOSIS — L8992 Pressure ulcer of unspecified site, stage 2: Secondary | ICD-10-CM

## 2013-03-23 NOTE — Telephone Encounter (Signed)
Samantha from [facility], called stating that the patient has open wounds on her chin and chest. Lelon Mast was wondering if we could open her back to [facility], pt was just d/c'd from their care on 03/12/13. Please advise.

## 2013-03-23 NOTE — Telephone Encounter (Signed)
Referral made, but since patient has not been seen in office since Dec 27th they may require a face to face OV for new referral

## 2013-04-05 ENCOUNTER — Telehealth: Payer: Self-pay | Admitting: *Deleted

## 2013-04-05 NOTE — Telephone Encounter (Signed)
Facility called for order for urine for UTI order given per Dr. Darrick Huntsman. Patient s/s increased confusion, frequency.

## 2013-04-07 ENCOUNTER — Other Ambulatory Visit: Payer: Self-pay | Admitting: Internal Medicine

## 2013-04-07 MED ORDER — HYDROCODONE-ACETAMINOPHEN 5-325 MG PO TABS: 1.0000 | ORAL_TABLET | Freq: Four times a day (QID) | ORAL | Status: DC | PRN

## 2013-04-21 ENCOUNTER — Other Ambulatory Visit: Payer: Self-pay | Admitting: Internal Medicine

## 2013-04-21 MED ORDER — HYDROCODONE-ACETAMINOPHEN 10-325 MG PO TABS: 1.0000 | ORAL_TABLET | Freq: Every day | ORAL | Status: DC

## 2013-04-28 ENCOUNTER — Other Ambulatory Visit: Payer: Self-pay | Admitting: Internal Medicine

## 2013-04-28 MED ORDER — ENSURE COMPLETE PO LIQD: 237.0000 mL | Freq: Every day | ORAL | Status: AC

## 2013-05-06 ENCOUNTER — Telehealth: Payer: Self-pay | Admitting: *Deleted

## 2013-05-06 DIAGNOSIS — F0390 Unspecified dementia without behavioral disturbance: Secondary | ICD-10-CM

## 2013-05-06 NOTE — Telephone Encounter (Signed)
Med tech called and stated patient more confused than normal recent UA was clear no signs of infection, Patient family asking could this be progression of dementia and is there anything you can advise,.

## 2013-05-06 NOTE — Telephone Encounter (Signed)
I recommend Hospice referral

## 2013-05-10 NOTE — Telephone Encounter (Signed)
Med tech would like to know if you will give order, Will fax.

## 2013-05-11 NOTE — Telephone Encounter (Signed)
Referral is in process as requested 

## 2013-05-12 NOTE — Telephone Encounter (Signed)
Facility called and would like a copy of the referral order sent to facility. Sent copy of referral order from chart. FYI

## 2013-05-13 ENCOUNTER — Telehealth: Payer: Self-pay | Admitting: Internal Medicine

## 2013-05-13 NOTE — Telephone Encounter (Signed)
Amil Amen @ [facility] left me a voicemail stating that they went out to see the patient and she patient is doing relatively well. The patent is gaining weight and Hospice services are not needed at this time. They informed the facility to call if things start to change. FYI

## 2013-05-13 NOTE — Telephone Encounter (Signed)
If patient is not a hospice candidate , then she will need to schedule an appt with me or Raquel to eval and treat worsening mental status

## 2013-05-19 ENCOUNTER — Encounter: Payer: Self-pay | Admitting: Internal Medicine

## 2013-06-02 DIAGNOSIS — M436 Torticollis: Secondary | ICD-10-CM

## 2013-06-02 DIAGNOSIS — I10 Essential (primary) hypertension: Secondary | ICD-10-CM

## 2013-06-02 DIAGNOSIS — L8992 Pressure ulcer of unspecified site, stage 2: Secondary | ICD-10-CM

## 2013-06-02 DIAGNOSIS — L89899 Pressure ulcer of other site, unspecified stage: Secondary | ICD-10-CM

## 2013-06-10 ENCOUNTER — Encounter: Payer: Self-pay | Admitting: Internal Medicine

## 2013-06-15 ENCOUNTER — Other Ambulatory Visit: Payer: Self-pay | Admitting: Internal Medicine

## 2013-06-15 ENCOUNTER — Telehealth: Payer: Self-pay | Admitting: *Deleted

## 2013-06-15 DIAGNOSIS — L8992 Pressure ulcer of unspecified site, stage 2: Secondary | ICD-10-CM

## 2013-06-15 MED ORDER — CIPROFLOXACIN HCL 250 MG PO TABS: 250.0000 mg | ORAL_TABLET | Freq: Two times a day (BID) | ORAL | Status: DC

## 2013-06-15 NOTE — Progress Notes (Signed)
Pressure ulcer on neck needs ciprofloxadcin  orally bid x 7 days.   rx printed  confrim that [facility] wound care is in place at [facility]

## 2013-06-15 NOTE — Progress Notes (Signed)
Verified wound care in place and faxed script to facility and spoke with [facility] and advised of orders.

## 2013-06-15 NOTE — Telephone Encounter (Signed)
Med tech called and would like order to hold stool softener while patient is on ABX.

## 2013-06-16 NOTE — Telephone Encounter (Signed)
Agree.   Please hold stool softener for 7 days

## 2013-06-17 NOTE — Telephone Encounter (Signed)
Contacted facility with verbal order to stop stool softeners while patient is on ABX.

## 2013-06-22 ENCOUNTER — Encounter: Payer: Self-pay | Admitting: Internal Medicine

## 2013-06-23 DIAGNOSIS — Z0279 Encounter for issue of other medical certificate: Secondary | ICD-10-CM

## 2013-07-02 ENCOUNTER — Encounter: Payer: Self-pay | Admitting: Surgery

## 2013-07-05 ENCOUNTER — Encounter: Payer: Self-pay | Admitting: Surgery

## 2013-07-07 ENCOUNTER — Other Ambulatory Visit: Payer: Self-pay | Admitting: Internal Medicine

## 2013-07-07 MED ORDER — GENTAMICIN SULFATE 0.1 % EX OINT: TOPICAL_OINTMENT | Freq: Three times a day (TID) | CUTANEOUS | Status: AC

## 2013-07-08 ENCOUNTER — Telehealth: Payer: Self-pay | Admitting: *Deleted

## 2013-07-08 NOTE — Telephone Encounter (Signed)
Refill Request  Mepilex Border 4x4  #5  Use per physician instructions

## 2013-07-09 NOTE — Telephone Encounter (Signed)
Called to pharmacy with 2 refills

## 2013-07-23 ENCOUNTER — Encounter: Payer: Self-pay | Admitting: Internal Medicine

## 2013-07-27 ENCOUNTER — Telehealth: Payer: Self-pay | Admitting: *Deleted

## 2013-07-27 NOTE — Telephone Encounter (Signed)
Refill Request  Vigamox 0.5% eye sol ML   #3  Instill 1 drop into right eye 4 times daily for infection

## 2013-08-04 ENCOUNTER — Encounter: Payer: Self-pay | Admitting: Surgery

## 2013-08-06 DIAGNOSIS — M436 Torticollis: Secondary | ICD-10-CM

## 2013-08-06 DIAGNOSIS — L89899 Pressure ulcer of other site, unspecified stage: Secondary | ICD-10-CM

## 2013-08-06 DIAGNOSIS — L8992 Pressure ulcer of unspecified site, stage 2: Secondary | ICD-10-CM

## 2013-08-06 DIAGNOSIS — B964 Proteus (mirabilis) (morganii) as the cause of diseases classified elsewhere: Secondary | ICD-10-CM

## 2013-08-12 ENCOUNTER — Emergency Department: Payer: Self-pay | Admitting: Internal Medicine

## 2013-08-12 ENCOUNTER — Telehealth: Payer: Self-pay | Admitting: Internal Medicine

## 2013-08-12 DIAGNOSIS — R627 Adult failure to thrive: Secondary | ICD-10-CM

## 2013-08-12 DIAGNOSIS — R6251 Failure to thrive (child): Secondary | ICD-10-CM

## 2013-08-12 LAB — URINALYSIS, COMPLETE
Bacteria: NONE SEEN
Blood: NEGATIVE
Glucose,UR: NEGATIVE mg/dL (ref 0–75)
Leukocyte Esterase: NEGATIVE
Nitrite: NEGATIVE
Protein: NEGATIVE
Squamous Epithelial: 15
WBC UR: 5 /HPF (ref 0–5)

## 2013-08-12 LAB — CBC
HGB: 9.4 g/dL — ABNORMAL LOW (ref 12.0–16.0)
Platelet: 179 10*3/uL (ref 150–440)
RBC: 3.71 10*6/uL — ABNORMAL LOW (ref 3.80–5.20)
RDW: 15.9 % — ABNORMAL HIGH (ref 11.5–14.5)
WBC: 7.9 10*3/uL (ref 3.6–11.0)

## 2013-08-12 LAB — COMPREHENSIVE METABOLIC PANEL
Albumin: 2.8 g/dL — ABNORMAL LOW (ref 3.4–5.0)
Anion Gap: 2 — ABNORMAL LOW (ref 7–16)
Chloride: 100 mmol/L (ref 98–107)
Co2: 34 mmol/L — ABNORMAL HIGH (ref 21–32)
Creatinine: 0.74 mg/dL (ref 0.60–1.30)
EGFR (African American): >60
Glucose: 116 mg/dL — ABNORMAL HIGH (ref 65–99)
Osmolality: 273 (ref 275–301)
Potassium: 4.1 mmol/L (ref 3.5–5.1)
SGOT(AST): 14 U/L — ABNORMAL LOW (ref 15–37)
SGPT (ALT): <6 U/L — ABNORMAL LOW (ref 12–78)
Sodium: 136 mmol/L (ref 136–145)
Total Protein: 7.1 g/dL (ref 6.4–8.2)

## 2013-08-12 NOTE — Telephone Encounter (Signed)
May I give verbal 

## 2013-08-12 NOTE — Telephone Encounter (Signed)
States pt is discharging from ER at [facility] today and they are requesting Hospice care.  Need approval from Dr. Darrick Huntsman.

## 2013-08-12 NOTE — Telephone Encounter (Signed)
Referral to hospice authorized , I will order

## 2013-08-13 NOTE — Telephone Encounter (Signed)
Hospice notified 

## 2013-08-30 ENCOUNTER — Other Ambulatory Visit: Payer: Self-pay | Admitting: *Deleted

## 2013-08-30 ENCOUNTER — Other Ambulatory Visit: Payer: Self-pay | Admitting: Internal Medicine

## 2013-08-30 DIAGNOSIS — C549 Malignant neoplasm of corpus uteri, unspecified: Secondary | ICD-10-CM

## 2013-08-30 DIAGNOSIS — G20 Parkinson's disease: Secondary | ICD-10-CM

## 2013-08-30 DIAGNOSIS — R627 Adult failure to thrive: Secondary | ICD-10-CM

## 2013-08-30 MED ORDER — CIPROFLOXACIN HCL 250 MG PO TABS: 250.0000 mg | ORAL_TABLET | Freq: Two times a day (BID) | ORAL | Status: AC

## 2013-08-30 MED ORDER — PANTOPRAZOLE SODIUM 40 MG PO TBEC: 40.0000 mg | DELAYED_RELEASE_TABLET | Freq: Every day | ORAL | Status: DC

## 2013-09-02 ENCOUNTER — Telehealth: Payer: Self-pay | Admitting: *Deleted

## 2013-09-02 NOTE — Telephone Encounter (Signed)
Refill Request  Refresh PM ointment   #3.5  Instill 1/4" strip into each 3 times daily for dry eyes

## 2013-09-03 ENCOUNTER — Telehealth: Payer: Self-pay | Admitting: *Deleted

## 2013-09-03 NOTE — Telephone Encounter (Signed)
Ok to send, I don't see on medication list

## 2013-09-03 NOTE — Telephone Encounter (Signed)
Refill Request  Risperidone 0.5 mg tab  #30   Take one tablet by mouth once daily at bedtime for behavior

## 2013-09-03 NOTE — Telephone Encounter (Signed)
Patient is taking seroquel for agitation,  Do not refill risperidone

## 2013-09-04 ENCOUNTER — Encounter: Payer: Self-pay | Admitting: Surgery

## 2013-09-06 ENCOUNTER — Telehealth: Payer: Self-pay | Admitting: *Deleted

## 2013-09-06 MED ORDER — HYDROCODONE-ACETAMINOPHEN 10-325 MG PO TABS: 1.0000 | ORAL_TABLET | Freq: Every day | ORAL | Status: DC

## 2013-09-06 NOTE — Telephone Encounter (Signed)
On printer

## 2013-09-06 NOTE — Telephone Encounter (Signed)
Needs hard script for hydrocodone 10/325 QHS  To be faxed to facility [phone].

## 2013-09-06 NOTE — Telephone Encounter (Signed)
Script faxed to facility as requested.

## 2013-09-07 ENCOUNTER — Telehealth: Payer: Self-pay | Admitting: *Deleted

## 2013-09-07 NOTE — Telephone Encounter (Signed)
Okay to refill? Not on med list. 

## 2013-09-07 NOTE — Telephone Encounter (Signed)
Refill Request  Vigamox 0.5% eye sol mL   #3  Instill 1 drop into right eye 4 times daily for infection

## 2013-09-07 NOTE — Telephone Encounter (Signed)
i have never filled that.  It is for an eye infection .  She will have to be seen by [place] or her eye doctor, to fill

## 2013-09-08 NOTE — Telephone Encounter (Signed)
I notified patient's sister & contacted [facility] & notified them that this request should go to Dr. Dorcas Mcmurray @ [facility]

## 2013-09-14 ENCOUNTER — Other Ambulatory Visit: Payer: Self-pay | Admitting: *Deleted

## 2013-09-14 MED ORDER — METOPROLOL SUCCINATE ER 50 MG PO TB24: 50.0000 mg | ORAL_TABLET | Freq: Two times a day (BID) | ORAL | Status: DC

## 2013-09-14 MED ORDER — MELOXICAM 15 MG PO TABS: 15.0000 mg | ORAL_TABLET | Freq: Every day | ORAL | Status: DC

## 2013-09-15 ENCOUNTER — Telehealth: Payer: Self-pay | Admitting: Internal Medicine

## 2013-09-15 NOTE — Telephone Encounter (Signed)
Maria Marsh at [facility] left vm.  Pt has been on Lopressor metoprolol titrate 50 mg b.i.d.  New ERx for Toprol XL 50 mg b.i.d.  Which do they fill?  Please contact Maria Marsh [phone] @ [facility]

## 2013-09-16 ENCOUNTER — Other Ambulatory Visit: Payer: Self-pay | Admitting: Internal Medicine

## 2013-09-16 MED ORDER — HYDROCODONE-ACETAMINOPHEN 10-325 MG PO TABS: 1.0000 | ORAL_TABLET | Freq: Every day | ORAL | Status: DC

## 2013-09-16 MED ORDER — METOPROLOL SUCCINATE ER 50 MG PO TB24: 50.0000 mg | ORAL_TABLET | Freq: Every day | ORAL | Status: AC

## 2013-09-16 NOTE — Telephone Encounter (Signed)
Toprol XL 50 mg once daily.  New rx sent

## 2013-09-16 NOTE — Telephone Encounter (Signed)
Please advise chart states Toprol XL , looked for notes to verify change could not find please advise?

## 2013-09-27 ENCOUNTER — Other Ambulatory Visit: Payer: Self-pay | Admitting: *Deleted

## 2013-09-27 ENCOUNTER — Telehealth: Payer: Self-pay | Admitting: *Deleted

## 2013-09-27 DIAGNOSIS — R451 Restlessness and agitation: Secondary | ICD-10-CM

## 2013-09-27 MED ORDER — PANTOPRAZOLE SODIUM 40 MG PO TBEC: 40.0000 mg | DELAYED_RELEASE_TABLET | Freq: Every day | ORAL | Status: AC

## 2013-09-27 NOTE — Telephone Encounter (Signed)
Refill request for Risperidone 0.5 mg   #30   Take 1 tablet by mouth once daily at bedtime for behavior.

## 2013-09-27 NOTE — Telephone Encounter (Signed)
Refill for risperidone received.  Records indicate she is taking seroquel,  Confirm that she is not taking both,  Can refill risperidone

## 2013-09-28 MED ORDER — RISPERIDONE 0.5 MG PO TBDP: 0.5000 mg | ORAL_TABLET | Freq: Every day | ORAL | Status: DC

## 2013-09-28 NOTE — Telephone Encounter (Signed)
Ok to refill,  Refill sent  

## 2013-09-28 NOTE — Telephone Encounter (Signed)
no

## 2013-10-04 ENCOUNTER — Encounter: Payer: Self-pay | Admitting: Surgery

## 2013-10-06 ENCOUNTER — Telehealth: Payer: Self-pay | Admitting: *Deleted

## 2013-10-06 MED ORDER — HYDROCODONE-ACETAMINOPHEN 10-325 MG PO TABS: 1.0000 | ORAL_TABLET | Freq: Every day | ORAL | Status: DC

## 2013-10-06 NOTE — Telephone Encounter (Signed)
On printer

## 2013-10-06 NOTE — Telephone Encounter (Signed)
Norco 10/325 QHS PRN patient needs refill [facility] calling last fill 09/17/13 please advise.

## 2013-10-06 NOTE — Telephone Encounter (Signed)
Rx faxed to [facility]

## 2013-10-11 ENCOUNTER — Other Ambulatory Visit: Payer: Self-pay | Admitting: *Deleted

## 2013-10-11 NOTE — Telephone Encounter (Signed)
Ok refill? I don't see where you had refilled this in past

## 2013-10-12 ENCOUNTER — Telehealth: Payer: Self-pay | Admitting: *Deleted

## 2013-10-12 MED ORDER — CARBIDOPA-LEVODOPA 25-100 MG PO TABS: 1.0000 | ORAL_TABLET | Freq: Four times a day (QID) | ORAL | Status: AC

## 2013-10-12 MED ORDER — HYDROCODONE-ACETAMINOPHEN 10-325 MG PO TABS: 1.0000 | ORAL_TABLET | Freq: Every day | ORAL | Status: DC

## 2013-10-12 NOTE — Telephone Encounter (Signed)
Wants hardcopy of Norco 10-325 from 10/06/13

## 2013-10-12 NOTE — Telephone Encounter (Signed)
Faxed duplicate printed by Dr. Darrick Huntsman to pharmacy with Hospice patient on script.

## 2013-10-12 NOTE — Telephone Encounter (Signed)
rx printed

## 2013-10-19 ENCOUNTER — Telehealth: Payer: Self-pay | Admitting: *Deleted

## 2013-10-19 MED ORDER — CARBOXYMETHYLCELLULOSE SODIUM 1 % OP SOLN: 1.0000 [drp] | Freq: Three times a day (TID) | OPHTHALMIC | Status: DC

## 2013-10-19 NOTE — Telephone Encounter (Signed)
Refill Request  Refresh PM ointment   #3.5  Instill 1/4" strip into each eyes 3 times daily for dry eyes

## 2013-10-19 NOTE — Telephone Encounter (Signed)
rx faxed to pharmacy

## 2013-10-19 NOTE — Telephone Encounter (Signed)
Ok to refill,  printed rx to send to facility or pharmacy

## 2013-10-19 NOTE — Telephone Encounter (Signed)
Ok? Not on med list

## 2013-10-25 ENCOUNTER — Telehealth: Payer: Self-pay | Admitting: Internal Medicine

## 2013-10-25 MED ORDER — HYDROCODONE-ACETAMINOPHEN 10-325 MG PO TABS: 1.0000 | ORAL_TABLET | Freq: Every day | ORAL | Status: DC

## 2013-10-25 NOTE — Telephone Encounter (Signed)
Rx printed, signed and faxed to number listed.

## 2013-10-25 NOTE — Telephone Encounter (Deleted)
error 

## 2013-10-25 NOTE — Telephone Encounter (Signed)
Fine to fill. 

## 2013-10-25 NOTE — Telephone Encounter (Signed)
C2 for norco 10/325  rx needed for hospice pt.  Please hand write hospice pt on and fax to [phone]

## 2013-10-29 ENCOUNTER — Other Ambulatory Visit: Payer: Self-pay | Admitting: Adult Health

## 2013-10-29 ENCOUNTER — Telehealth: Payer: Self-pay | Admitting: Internal Medicine

## 2013-10-29 MED ORDER — CLINDAMYCIN HCL 300 MG PO CAPS: 300.0000 mg | ORAL_CAPSULE | Freq: Three times a day (TID) | ORAL | Status: AC

## 2013-10-29 MED ORDER — CLINDAMYCIN HCL 300 MG PO CAPS: 300.0000 mg | ORAL_CAPSULE | Freq: Three times a day (TID) | ORAL | Status: DC

## 2013-10-29 NOTE — Telephone Encounter (Signed)
Spoke with French Ana, pt is at [facility]. Someone can come in and do CXR if necessary. Rxs need to be faxed to [facility]. French Ana would like call back when completed, may leave VM.

## 2013-10-29 NOTE — Telephone Encounter (Signed)
Pt is experiencing a lot of bronchial congestion, has rales through all lung fields.  No fever but is warm to touch with increased confusion and lethargy.  Asking if they can do a chest x-ray or go ahead and order an antibiotic for the pt.  Chances are likely she has probably aspirated a little bit.

## 2013-10-29 NOTE — Telephone Encounter (Signed)
Let message with French Ana, notifying of antibiotic sent to [facility]

## 2013-10-29 NOTE — Progress Notes (Signed)
Faxed to [facility]

## 2013-11-01 ENCOUNTER — Other Ambulatory Visit: Payer: Self-pay | Admitting: Internal Medicine

## 2013-11-02 ENCOUNTER — Other Ambulatory Visit: Payer: Self-pay | Admitting: *Deleted

## 2013-11-03 MED ORDER — AMLODIPINE BESYLATE 5 MG PO TABS: 5.0000 mg | ORAL_TABLET | Freq: Every day | ORAL | Status: AC

## 2013-11-05 ENCOUNTER — Other Ambulatory Visit: Payer: Self-pay | Admitting: *Deleted

## 2013-11-05 MED ORDER — LOSARTAN POTASSIUM 100 MG PO TABS: 100.0000 mg | ORAL_TABLET | Freq: Every day | ORAL | Status: DC

## 2013-11-05 MED ORDER — CITALOPRAM HYDROBROMIDE 20 MG PO TABS: 20.0000 mg | ORAL_TABLET | Freq: Every day | ORAL | Status: DC

## 2013-11-05 NOTE — Telephone Encounter (Signed)
Okay to refill meds? Pt was last seen in 12/13.

## 2013-11-08 ENCOUNTER — Other Ambulatory Visit: Payer: Self-pay | Admitting: *Deleted

## 2013-11-08 MED ORDER — BRIMONIDINE TARTRATE-TIMOLOL 0.2-0.5 % OP SOLN
1.0000 [drp] | Freq: Two times a day (BID) | OPHTHALMIC | Status: AC
Start: 2013-11-08 — End: ?

## 2013-11-08 NOTE — Telephone Encounter (Signed)
Ok refill? 

## 2013-11-08 NOTE — Telephone Encounter (Signed)
Refill Request  Lantanoprost 0.005% eye Sol mL   #2.5  Instill 1 drop into each eye once daily at bedtime

## 2013-11-08 NOTE — Telephone Encounter (Signed)
Ok to refill,  Refill sent  

## 2013-11-30 ENCOUNTER — Other Ambulatory Visit: Payer: Self-pay | Admitting: *Deleted

## 2013-12-03 ENCOUNTER — Other Ambulatory Visit: Payer: Self-pay | Admitting: Internal Medicine

## 2013-12-03 NOTE — Telephone Encounter (Deleted)
Hospice patient, ok refill?

## 2013-12-03 NOTE — Telephone Encounter (Signed)
Ok to refill,  printed rx  

## 2013-12-03 NOTE — Telephone Encounter (Signed)
Rx faxed

## 2013-12-15 ENCOUNTER — Other Ambulatory Visit: Payer: Self-pay | Admitting: Internal Medicine

## 2013-12-15 DIAGNOSIS — R059 Cough, unspecified: Secondary | ICD-10-CM

## 2013-12-15 DIAGNOSIS — R05 Cough: Secondary | ICD-10-CM

## 2013-12-15 MED ORDER — LEVOFLOXACIN 500 MG PO TABS: ORAL_TABLET | ORAL | Status: DC

## 2013-12-15 MED ORDER — LEVOFLOXACIN 500 MG PO TABS: ORAL_TABLET | ORAL | Status: AC

## 2013-12-17 ENCOUNTER — Other Ambulatory Visit: Payer: Self-pay | Admitting: *Deleted

## 2013-12-17 NOTE — Telephone Encounter (Signed)
Refill

## 2013-12-19 MED ORDER — HYDROCODONE-ACETAMINOPHEN 10-325 MG PO TABS: ORAL_TABLET | ORAL | Status: DC

## 2013-12-19 NOTE — Telephone Encounter (Signed)
Ok to refill,  printed rx  

## 2013-12-22 ENCOUNTER — Other Ambulatory Visit: Payer: Self-pay | Admitting: *Deleted

## 2013-12-22 DIAGNOSIS — R451 Restlessness and agitation: Secondary | ICD-10-CM

## 2013-12-22 NOTE — Telephone Encounter (Signed)
Ok refill? 

## 2013-12-23 MED ORDER — RISPERIDONE 0.5 MG PO TBDP: 0.5000 mg | ORAL_TABLET | Freq: Every day | ORAL | Status: AC

## 2013-12-23 MED ORDER — MELOXICAM 15 MG PO TABS: 15.0000 mg | ORAL_TABLET | Freq: Every day | ORAL | Status: AC

## 2013-12-23 NOTE — Telephone Encounter (Signed)
Ok to refill,  Authorized in epic 

## 2013-12-27 ENCOUNTER — Telehealth: Payer: Self-pay | Admitting: *Deleted

## 2013-12-27 MED ORDER — CARBOXYMETHYLCELLULOSE SODIUM 1 % OP SOLN: 1.0000 [drp] | Freq: Three times a day (TID) | OPHTHALMIC | Status: AC

## 2013-12-27 NOTE — Telephone Encounter (Signed)
Refill Request  Genteal 0.3% eye gel ML   #10   Instill 1 drop into each eye 3 times daily

## 2013-12-27 NOTE — Telephone Encounter (Signed)
Ok to refill,  printed rx  

## 2013-12-27 NOTE — Telephone Encounter (Signed)
Refresh AND GENTEAL ARE THE SAME.  DOES NOT NEED BOTH,  REFRESH ALREADY REFILLED TODAY

## 2013-12-27 NOTE — Telephone Encounter (Signed)
Okay to refill? 

## 2013-12-27 NOTE — Telephone Encounter (Signed)
Refill? Solution on med list, not ointment

## 2013-12-27 NOTE — Telephone Encounter (Signed)
Refill Request  Refresh PM ointment

## 2013-12-28 NOTE — Telephone Encounter (Signed)
Script sent as requested. 

## 2013-12-31 ENCOUNTER — Other Ambulatory Visit: Payer: Self-pay | Admitting: *Deleted

## 2013-12-31 MED ORDER — LOSARTAN POTASSIUM 100 MG PO TABS: 100.0000 mg | ORAL_TABLET | Freq: Every day | ORAL | Status: AC

## 2014-01-04 ENCOUNTER — Other Ambulatory Visit: Payer: Self-pay | Admitting: *Deleted

## 2014-01-04 MED ORDER — CITALOPRAM HYDROBROMIDE 20 MG PO TABS: 20.0000 mg | ORAL_TABLET | Freq: Every day | ORAL | Status: AC

## 2014-01-05 ENCOUNTER — Telehealth: Payer: Self-pay | Admitting: *Deleted

## 2014-01-05 NOTE — Telephone Encounter (Signed)
Called [facility] talked with Med= tech and the requesting Nurse is an Hospice RN, placed request back in red folder please advise.

## 2014-01-06 ENCOUNTER — Other Ambulatory Visit: Payer: Self-pay | Admitting: Internal Medicine

## 2014-01-06 MED ORDER — OXYCODONE HCL 20 MG/ML PO CONC: 10.0000 mg | ORAL | Status: AC | PRN

## 2014-01-06 MED ORDER — LORAZEPAM 0.5 MG PO TABS: 0.5000 mg | ORAL_TABLET | ORAL | Status: AC | PRN

## 2014-01-06 NOTE — Progress Notes (Signed)
Scripts requested by hospice faxed to pharmacy and to facility.

## 2014-01-17 ENCOUNTER — Other Ambulatory Visit: Payer: Self-pay | Admitting: Internal Medicine

## 2014-01-17 NOTE — Telephone Encounter (Signed)
Faxed script to pharmacy

## 2014-01-17 NOTE — Telephone Encounter (Signed)
Ok to refill,  printed rx  

## 2014-01-17 NOTE — Telephone Encounter (Signed)
Last fill 12/19/13 okay to fill?

## 2014-01-25 ENCOUNTER — Telehealth: Payer: Self-pay | Admitting: Internal Medicine

## 2014-01-25 NOTE — Telephone Encounter (Signed)
Death Certificate Signed.,  Please copy for chart and d notify [place] for pickup  [place]

## 2014-01-25 NOTE — Telephone Encounter (Signed)
This has been completed.

## 2014-02-01 ENCOUNTER — Telehealth: Payer: Self-pay

## 2014-02-01 NOTE — Telephone Encounter (Signed)
Patient past away @ [place] per [employer]

## 2014-02-02 DEATH — deceased

## 2014-11-01 NOTE — Telephone Encounter (Signed)
This encounter was created in error - please disregard.

## 2017-06-26 NOTE — Telephone Encounter (Signed)
error 

## 2017-07-30 NOTE — Progress Notes (Signed)
Orders
